# Patient Record
Sex: Female | Born: 1960 | Race: Black or African American | Hispanic: No | Smoking: Never smoker
Health system: Southern US, Community
[De-identification: ages and names within clinical notes are randomized; demographics above are authoritative.]

## PROBLEM LIST (undated history)

## (undated) DIAGNOSIS — T7840XA Allergy, unspecified, initial encounter: Secondary | ICD-10-CM

## (undated) DIAGNOSIS — M1990 Unspecified osteoarthritis, unspecified site: Secondary | ICD-10-CM

## (undated) DIAGNOSIS — E119 Type 2 diabetes mellitus without complications: Secondary | ICD-10-CM

## (undated) DIAGNOSIS — M5020 Other cervical disc displacement, unspecified cervical region: Secondary | ICD-10-CM

## (undated) DIAGNOSIS — E785 Hyperlipidemia, unspecified: Secondary | ICD-10-CM

## (undated) DIAGNOSIS — S0300XA Dislocation of jaw, unspecified side, initial encounter: Secondary | ICD-10-CM

## (undated) DIAGNOSIS — N6009 Solitary cyst of unspecified breast: Secondary | ICD-10-CM

## (undated) DIAGNOSIS — D649 Anemia, unspecified: Secondary | ICD-10-CM

## (undated) DIAGNOSIS — I739 Peripheral vascular disease, unspecified: Secondary | ICD-10-CM

## (undated) DIAGNOSIS — I10 Essential (primary) hypertension: Secondary | ICD-10-CM

## (undated) DIAGNOSIS — K219 Gastro-esophageal reflux disease without esophagitis: Secondary | ICD-10-CM

## (undated) DIAGNOSIS — K649 Unspecified hemorrhoids: Secondary | ICD-10-CM

## (undated) HISTORY — DX: Peripheral vascular disease, unspecified: I73.9

## (undated) HISTORY — DX: Gastro-esophageal reflux disease without esophagitis: K21.9

## (undated) HISTORY — PX: ABDOMINAL HYSTERECTOMY: SHX81

## (undated) HISTORY — PX: BILATERAL CARPAL TUNNEL RELEASE: SHX6508

## (undated) HISTORY — DX: Type 2 diabetes mellitus without complications: E11.9

## (undated) HISTORY — DX: Essential (primary) hypertension: I10

## (undated) HISTORY — DX: Allergy, unspecified, initial encounter: T78.40XA

## (undated) HISTORY — PX: BREAST CYST ASPIRATION: SHX578

## (undated) HISTORY — DX: Unspecified hemorrhoids: K64.9

## (undated) HISTORY — DX: Anemia, unspecified: D64.9

## (undated) HISTORY — DX: Other cervical disc displacement, unspecified cervical region: M50.20

## (undated) HISTORY — PX: VEIN SURGERY: SHX48

## (undated) HISTORY — PX: HAND SURGERY: SHX662

## (undated) HISTORY — DX: Hyperlipidemia, unspecified: E78.5

## (undated) HISTORY — DX: Unspecified osteoarthritis, unspecified site: M19.90

---

## 1988-06-08 HISTORY — PX: TUBAL LIGATION: SHX77

## 2005-04-21 ENCOUNTER — Emergency Department: Payer: Self-pay | Admitting: Emergency Medicine

## 2005-05-27 ENCOUNTER — Ambulatory Visit: Payer: Self-pay | Admitting: Family Medicine

## 2006-09-07 ENCOUNTER — Ambulatory Visit: Payer: Self-pay | Admitting: Nurse Practitioner

## 2006-11-02 ENCOUNTER — Ambulatory Visit: Payer: Self-pay | Admitting: Nurse Practitioner

## 2007-11-23 ENCOUNTER — Ambulatory Visit: Payer: Self-pay | Admitting: Family Medicine

## 2008-11-23 ENCOUNTER — Ambulatory Visit: Payer: Self-pay | Admitting: Family Medicine

## 2008-11-28 ENCOUNTER — Ambulatory Visit: Payer: Self-pay | Admitting: Family Medicine

## 2009-01-22 ENCOUNTER — Emergency Department: Payer: Self-pay | Admitting: Emergency Medicine

## 2009-05-29 ENCOUNTER — Ambulatory Visit: Payer: Self-pay | Admitting: Family Medicine

## 2009-08-06 ENCOUNTER — Ambulatory Visit: Payer: Self-pay | Admitting: Family Medicine

## 2009-11-28 ENCOUNTER — Ambulatory Visit: Payer: Self-pay | Admitting: Family Medicine

## 2009-12-17 ENCOUNTER — Encounter: Payer: Self-pay | Admitting: Family Medicine

## 2010-01-07 ENCOUNTER — Ambulatory Visit: Payer: Self-pay | Admitting: Gastroenterology

## 2010-01-09 LAB — PATHOLOGY REPORT

## 2010-12-03 ENCOUNTER — Ambulatory Visit: Payer: Self-pay | Admitting: Family Medicine

## 2011-08-31 ENCOUNTER — Ambulatory Visit: Payer: Self-pay | Admitting: Specialist

## 2011-10-28 ENCOUNTER — Ambulatory Visit: Payer: Self-pay | Admitting: Anesthesiology

## 2011-11-04 ENCOUNTER — Ambulatory Visit: Payer: Self-pay | Admitting: Anesthesiology

## 2011-12-03 ENCOUNTER — Ambulatory Visit: Payer: Self-pay | Admitting: Anesthesiology

## 2011-12-07 ENCOUNTER — Ambulatory Visit: Payer: Self-pay | Admitting: Family Medicine

## 2012-01-06 ENCOUNTER — Ambulatory Visit: Payer: Self-pay | Admitting: Anesthesiology

## 2012-01-25 ENCOUNTER — Ambulatory Visit: Payer: Self-pay | Admitting: Anesthesiology

## 2012-02-22 ENCOUNTER — Encounter: Payer: Self-pay | Admitting: Anesthesiology

## 2012-03-01 ENCOUNTER — Ambulatory Visit: Payer: Self-pay | Admitting: Anesthesiology

## 2012-07-25 ENCOUNTER — Emergency Department: Payer: Self-pay | Admitting: Emergency Medicine

## 2012-07-25 LAB — CBC
HCT: 38.6 % (ref 35.0–47.0)
HGB: 12.7 g/dL (ref 12.0–16.0)
MCH: 28.4 pg (ref 26.0–34.0)
MCHC: 32.9 g/dL (ref 32.0–36.0)
MCV: 86 fL (ref 80–100)
RBC: 4.48 10*6/uL (ref 3.80–5.20)
RDW: 13.2 % (ref 11.5–14.5)
WBC: 7.8 10*3/uL (ref 3.6–11.0)

## 2012-07-25 LAB — BASIC METABOLIC PANEL
Anion Gap: 8 (ref 7–16)
BUN: 10 mg/dL (ref 7–18)
Calcium, Total: 8.7 mg/dL (ref 8.5–10.1)
Chloride: 104 mmol/L (ref 98–107)
Creatinine: 0.76 mg/dL (ref 0.60–1.30)
EGFR (African American): >60
Osmolality: 280 (ref 275–301)
Potassium: 3.1 mmol/L — ABNORMAL LOW (ref 3.5–5.1)
Sodium: 136 mmol/L (ref 136–145)

## 2012-07-25 LAB — TROPONIN I: Troponin-I: <0.02 ng/mL

## 2012-07-26 LAB — CK TOTAL AND CKMB (NOT AT ARMC): CK-MB: <0.5 ng/mL — ABNORMAL LOW (ref 0.5–3.6)

## 2012-11-02 ENCOUNTER — Emergency Department: Payer: Self-pay | Admitting: Internal Medicine

## 2012-12-21 ENCOUNTER — Ambulatory Visit: Payer: Self-pay

## 2013-02-04 ENCOUNTER — Emergency Department: Payer: Self-pay | Admitting: Emergency Medicine

## 2013-05-21 ENCOUNTER — Emergency Department: Payer: Self-pay | Admitting: Emergency Medicine

## 2013-05-24 ENCOUNTER — Ambulatory Visit: Payer: Self-pay | Admitting: Adult Health

## 2013-05-31 ENCOUNTER — Ambulatory Visit: Payer: Self-pay | Admitting: Adult Health

## 2013-09-07 ENCOUNTER — Ambulatory Visit: Payer: Self-pay

## 2013-12-13 ENCOUNTER — Ambulatory Visit: Payer: Self-pay | Admitting: Urology

## 2013-12-25 ENCOUNTER — Emergency Department: Payer: Self-pay | Admitting: Emergency Medicine

## 2013-12-25 LAB — CBC
HCT: 42.7 % (ref 35.0–47.0)
HGB: 13.8 g/dL (ref 12.0–16.0)
MCH: 28.7 pg (ref 26.0–34.0)
MCHC: 32.4 g/dL (ref 32.0–36.0)
MCV: 89 fL (ref 80–100)
Platelet: 395 10*3/uL (ref 150–440)
RBC: 4.82 10*6/uL (ref 3.80–5.20)
RDW: 13.3 % (ref 11.5–14.5)
WBC: 8.8 10*3/uL (ref 3.6–11.0)

## 2013-12-25 LAB — URINALYSIS, COMPLETE
BILIRUBIN, UR: NEGATIVE
Blood: NEGATIVE
Glucose,UR: NEGATIVE mg/dL (ref 0–75)
NITRITE: NEGATIVE
PH: 5 (ref 4.5–8.0)
PROTEIN: NEGATIVE
RBC,UR: 3 /HPF (ref 0–5)
Specific Gravity: 1.02 (ref 1.003–1.030)
WBC UR: 10 /HPF (ref 0–5)

## 2013-12-25 LAB — COMPREHENSIVE METABOLIC PANEL
ANION GAP: 8 (ref 7–16)
AST: 17 U/L (ref 15–37)
Albumin: 3.8 g/dL (ref 3.4–5.0)
Alkaline Phosphatase: 53 U/L
BILIRUBIN TOTAL: 0.5 mg/dL (ref 0.2–1.0)
BUN: 13 mg/dL (ref 7–18)
CALCIUM: 9.2 mg/dL (ref 8.5–10.1)
CO2: 26 mmol/L (ref 21–32)
CREATININE: 0.92 mg/dL (ref 0.60–1.30)
Chloride: 102 mmol/L (ref 98–107)
EGFR (Non-African Amer.): >60
GLUCOSE: 140 mg/dL — AB (ref 65–99)
Osmolality: 274 (ref 275–301)
Potassium: 3.3 mmol/L — ABNORMAL LOW (ref 3.5–5.1)
SGPT (ALT): 15 U/L (ref 12–78)
Sodium: 136 mmol/L (ref 136–145)
Total Protein: 8.1 g/dL (ref 6.4–8.2)

## 2013-12-25 LAB — LIPASE, BLOOD: LIPASE: 214 U/L (ref 73–393)

## 2014-05-22 ENCOUNTER — Emergency Department: Payer: Self-pay | Admitting: Emergency Medicine

## 2014-05-22 LAB — CBC WITH DIFFERENTIAL/PLATELET
Basophil #: 0.1 10*3/uL (ref 0.0–0.1)
Basophil %: 1.3 %
EOS ABS: 0.3 10*3/uL (ref 0.0–0.7)
EOS PCT: 4.8 %
HCT: 42.7 % (ref 35.0–47.0)
HGB: 13.8 g/dL (ref 12.0–16.0)
Lymphocyte #: 2.3 10*3/uL (ref 1.0–3.6)
Lymphocyte %: 35.9 %
MCH: 28.6 pg (ref 26.0–34.0)
MCHC: 32.3 g/dL (ref 32.0–36.0)
MCV: 89 fL (ref 80–100)
Monocyte #: 0.4 x10 3/mm (ref 0.2–0.9)
Monocyte %: 6 %
NEUTROS PCT: 52 %
Neutrophil #: 3.3 10*3/uL (ref 1.4–6.5)
Platelet: 376 10*3/uL (ref 150–440)
RBC: 4.81 10*6/uL (ref 3.80–5.20)
RDW: 13.1 % (ref 11.5–14.5)
WBC: 6.3 10*3/uL (ref 3.6–11.0)

## 2014-05-22 LAB — URINALYSIS, COMPLETE
BACTERIA: NONE SEEN
Bilirubin,UR: NEGATIVE
Blood: NEGATIVE
Glucose,UR: >=500 mg/dL (ref 0–75)
KETONE: NEGATIVE
Leukocyte Esterase: NEGATIVE
NITRITE: NEGATIVE
Ph: 6 (ref 4.5–8.0)
Protein: NEGATIVE
RBC,UR: <1 /HPF (ref 0–5)
SQUAMOUS EPITHELIAL: NONE SEEN
Specific Gravity: 1.014 (ref 1.003–1.030)
WBC UR: <1 /HPF (ref 0–5)

## 2014-05-22 LAB — BASIC METABOLIC PANEL
ANION GAP: 7 (ref 7–16)
BUN: 11 mg/dL (ref 7–18)
CO2: 27 mmol/L (ref 21–32)
Calcium, Total: 9.3 mg/dL (ref 8.5–10.1)
Chloride: 102 mmol/L (ref 98–107)
Creatinine: 0.88 mg/dL (ref 0.60–1.30)
EGFR (African American): >60
EGFR (Non-African Amer.): >60
Glucose: 201 mg/dL — ABNORMAL HIGH (ref 65–99)
Osmolality: 277 (ref 275–301)
POTASSIUM: 3.7 mmol/L (ref 3.5–5.1)
Sodium: 136 mmol/L (ref 136–145)

## 2014-05-22 LAB — TROPONIN I: Troponin-I: <0.02 ng/mL

## 2014-07-19 LAB — CBC AND DIFFERENTIAL
HCT: 39 % (ref 36–46)
Hemoglobin: 13.5 g/dL (ref 12.0–16.0)
NEUTROS ABS: 5 /uL
Platelets: 425 10*3/uL — AB (ref 150–399)
WBC: 9 10^3/mL

## 2014-07-19 LAB — LIPID PANEL
Cholesterol: 210 mg/dL — AB (ref 0–200)
HDL: 60 mg/dL (ref 35–70)
LDL CALC: 106 mg/dL
TRIGLYCERIDES: 220 mg/dL — AB (ref 40–160)

## 2014-07-19 LAB — BASIC METABOLIC PANEL
BUN: 9 mg/dL (ref 4–21)
CREATININE: 0.7 mg/dL (ref 0.5–1.1)
GLUCOSE: 90 mg/dL
Potassium: 4.2 mmol/L (ref 3.4–5.3)
SODIUM: 139 mmol/L (ref 137–147)

## 2014-07-19 LAB — HEMOGLOBIN A1C: Hemoglobin A1C: 8.7

## 2014-07-19 LAB — HEPATIC FUNCTION PANEL
ALT: 6 U/L — AB (ref 7–35)
AST: 8 U/L — AB (ref 13–35)
Alkaline Phosphatase: 71 U/L (ref 25–125)
BILIRUBIN DIRECT: 0.12 mg/dL (ref 0.01–0.4)
BILIRUBIN, TOTAL: 0.4 mg/dL

## 2014-07-19 LAB — TSH: TSH: 1.64 u[IU]/mL (ref 0.41–5.90)

## 2015-11-04 IMAGING — CT CT ABD-PELV W/ CM
2 of 5 series · 16 of 46 positions shown, 18 images · IV contrast (agent unspecified)
Comparison: None.

CLINICAL DATA: Abdominal pain, right lower quadrant for 2 weeks.

EXAM:
CT ABDOMEN AND PELVIS WITH CONTRAST
TECHNIQUE: Multidetector CT imaging of the abdomen and pelvis was performed
using the standard protocol following bolus administration of
intravenous contrast.
CONTRAST:  100 mL [id number]

[Series 2: routine abd pel with · axial · 0.77mm/px · z∈[-576,-122]mm · 13 of 103 slices shown, 15 images]
[im 6/103  soft-tissue]
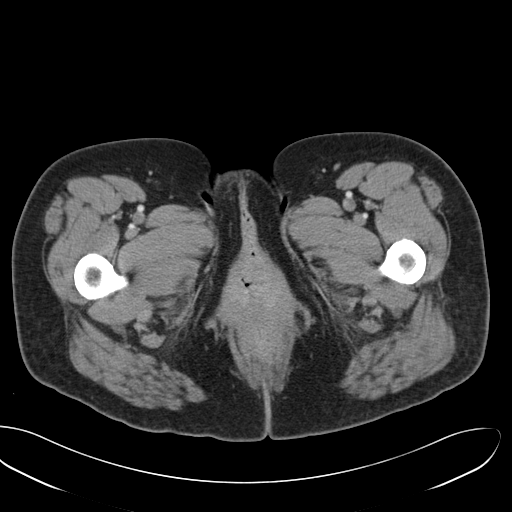
[im 6/103  bone]
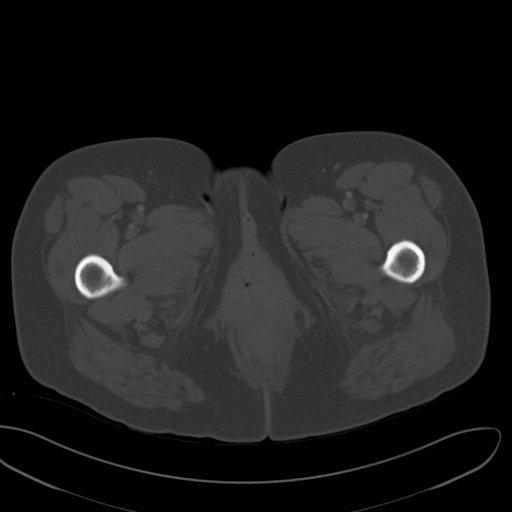
[im 12/103  soft-tissue]
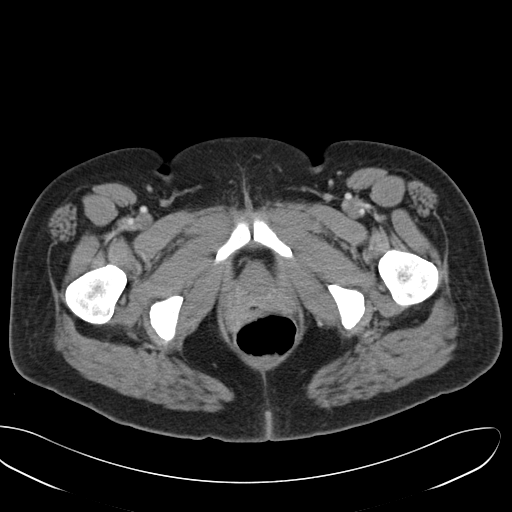
[im 23/103  soft-tissue]
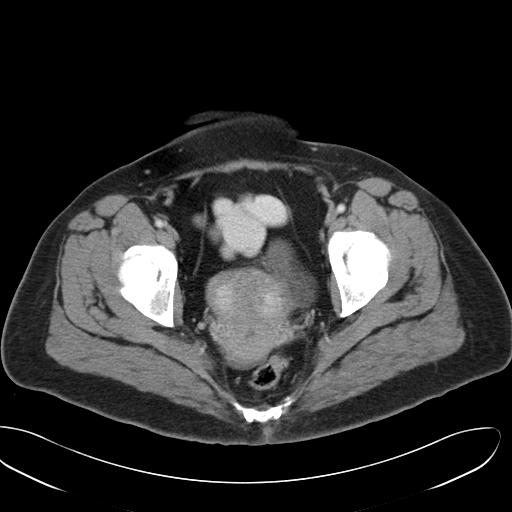
[im 29/103  soft-tissue]
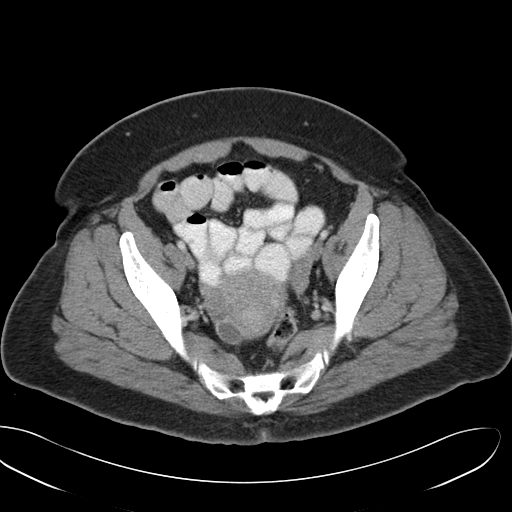
[im 35/103  soft-tissue]
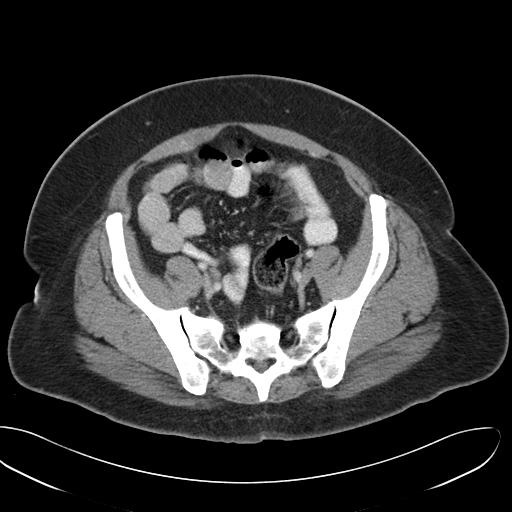
[im 46/103  soft-tissue]
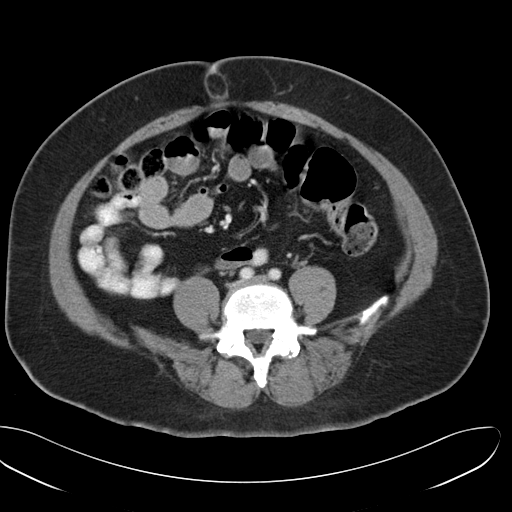
[im 52/103  soft-tissue]
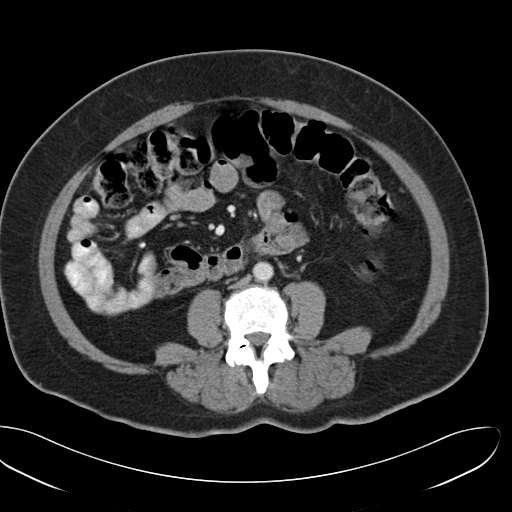
[im 57/103  soft-tissue]
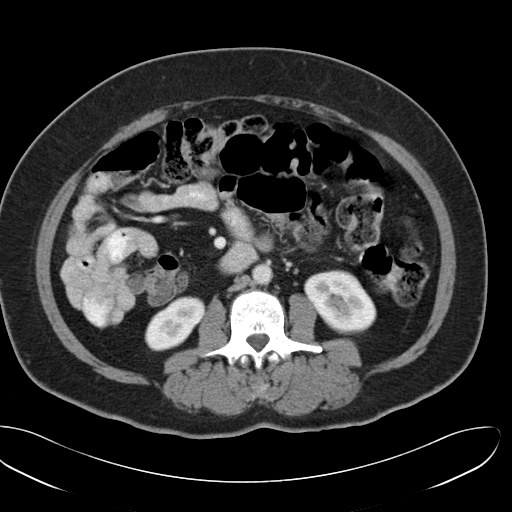
[im 69/103  soft-tissue]
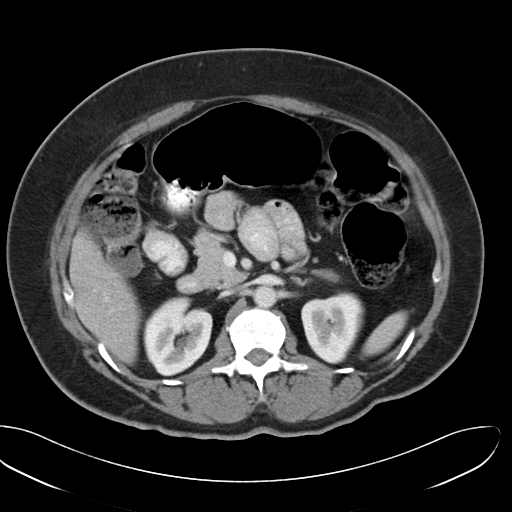
[im 69/103  bone]
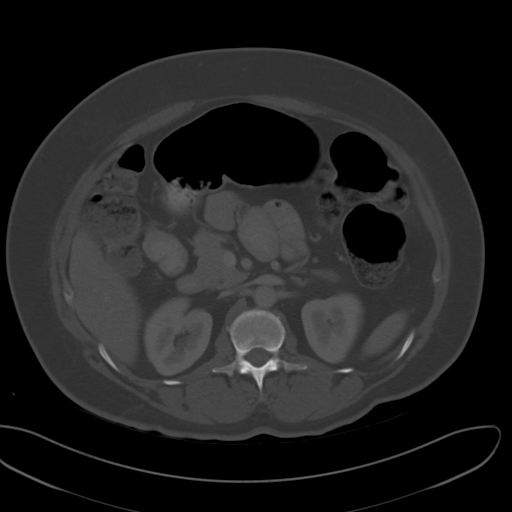
[im 74/103  soft-tissue]
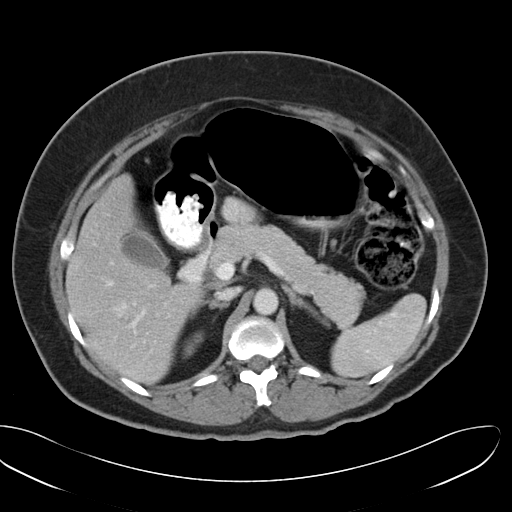
[im 80/103  soft-tissue]
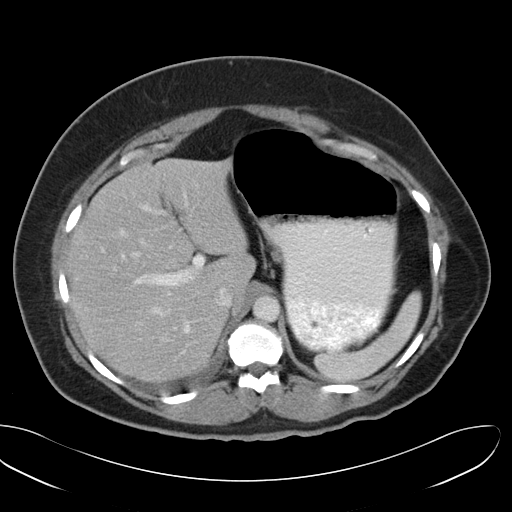
[im 91/103  soft-tissue]
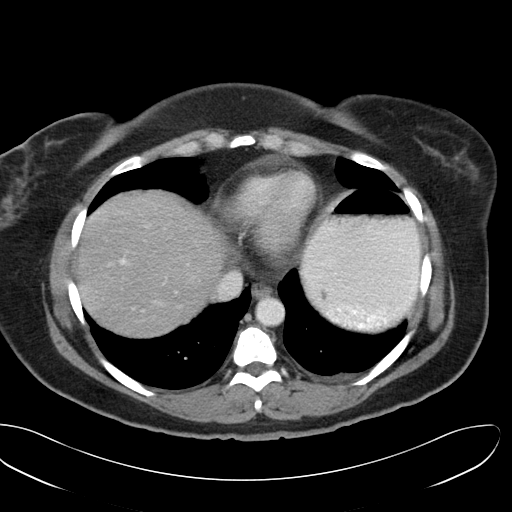
[im 97/103  soft-tissue]
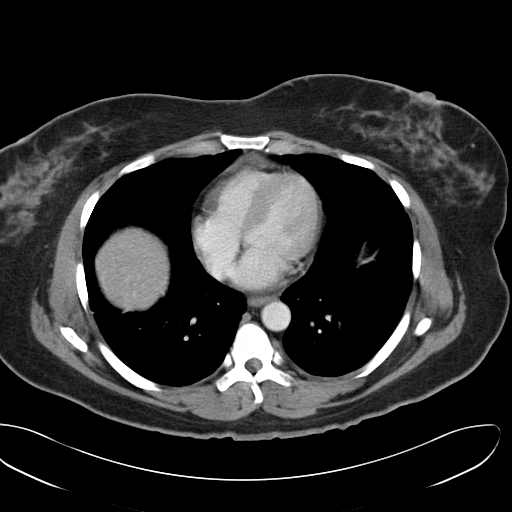

[Series 6: cor routine abd pel with · coronal · 0.82mm/px · 3 of 168 slices shown]
[im 56/168  soft-tissue]
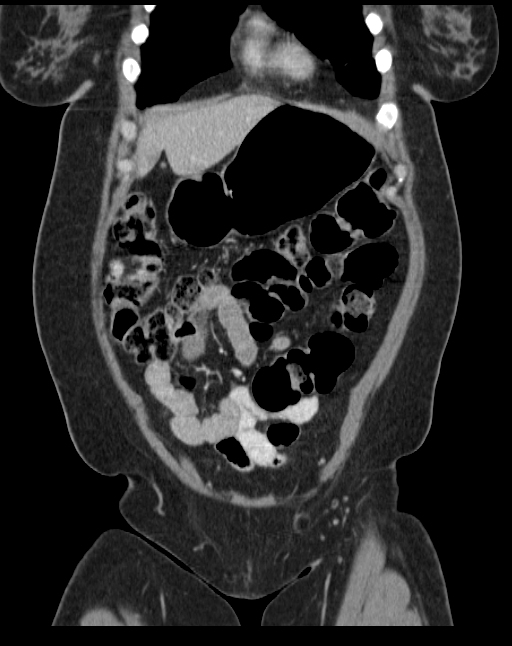
[im 75/168  soft-tissue]
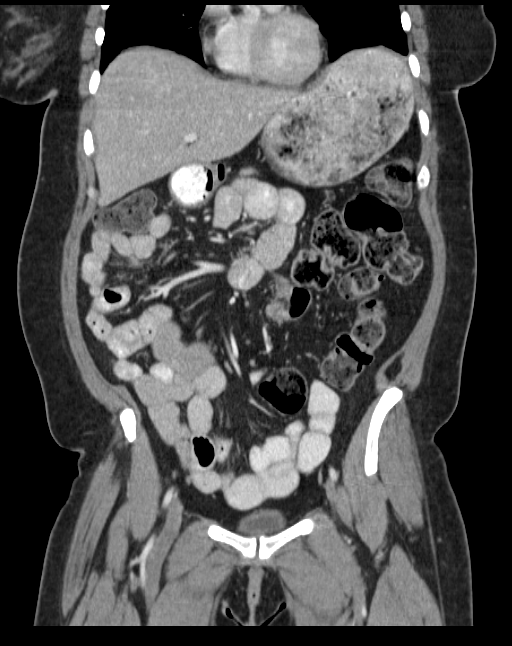
[im 93/168  soft-tissue]
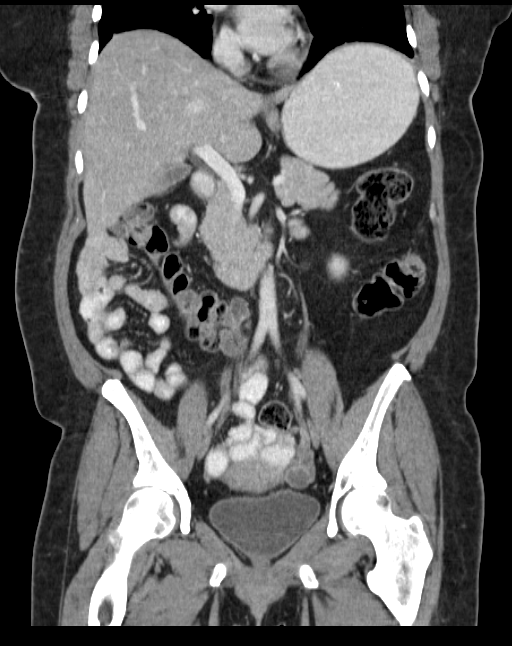

[16 of 46 positions shown; findings below may reference images not displayed]

FINDINGS: Visualization of the lower thorax demonstrates normal heart size.
Small amount of pericardial fluid. Minimal dependent atelectasis in
the bilateral lower lobes. Small bilateral pleural effusions.

Liver is normal in size and contour. 3 mm low-attenuation lesion
anterior right hepatic lobe (image 22; series 2), too small to
accurately characterize. The portal vein is patent. Gallbladder is
unremarkable.

The spleen, pancreas and bilateral adrenal glands are unremarkable.
Kidneys are symmetric in size and enhance with contrast. No
hydronephrosis.

Normal caliber abdominal aorta.  Urinary bladder is unremarkable.

Fibroid uterus. 2.5 x 2.1 x 2.5 cm cystic left adnexal structure
(image 78; series [DATE] x 1.7 x 2.7 cm cystic right adnexal
structure (image 74; series 2).

Normal caliber small and large bowel without evidence for
obstruction. The cecum is located within the central abdomen. The
majority of the small bowel lies within the right hemi abdomen. The
appendix is normal in appearance. No free fluid or free
intraperitoneal air.

No aggressive or acute appearing osseous lesions. Lower lumbar spine
degenerative changes.
IMPRESSION: No definite cause for acute abdominal pain identified.

There is suggestion of partial malrotation of the bowel with the
majority of the small bowel lying in the right hemi abdomen and the
cecum lying within the central aspect of the abdomen.

Fibroid uterus. Bilateral cystic adnexal structures. Recommend
dedicated evaluation with pelvic ultrasound.

## 2016-02-01 ENCOUNTER — Ambulatory Visit
Admission: EM | Admit: 2016-02-01 | Discharge: 2016-02-01 | Disposition: A | Discharge status: Home / Self Care | Payer: Commercial Managed Care - HMO | Attending: Family Medicine | Admitting: Family Medicine

## 2016-02-01 DIAGNOSIS — H6983 Other specified disorders of Eustachian tube, bilateral: Secondary | ICD-10-CM

## 2016-02-01 DIAGNOSIS — H9203 Otalgia, bilateral: Secondary | ICD-10-CM | POA: Diagnosis not present

## 2016-02-01 HISTORY — DX: Dislocation of jaw, unspecified side, initial encounter: S03.00XA

## 2016-02-01 HISTORY — DX: Solitary cyst of unspecified breast: N60.09

## 2016-02-01 MED ORDER — PREDNISONE 10 MG (21) PO TBPK: ORAL_TABLET | ORAL | 0 refills | Status: AC

## 2016-02-01 MED ORDER — LORATADINE-PSEUDOEPHEDRINE ER 10-240 MG PO TB24: 1.0000 | ORAL_TABLET | Freq: Every day | ORAL | 0 refills | Status: AC

## 2016-02-01 NOTE — ED Provider Notes (Addendum)
[facility]    CSN: [id number] Arrival date & time: 02/01/16  4098  First Provider Contact:  None       History   Chief Complaint Chief Complaint  Patient presents with  . Otalgia    both ears    HPI Wendy Jacobson is a 55 y.o. female.   Patient is here because of pain in her ears. She states that her ears been bothering her for several days but this morning the pain was a lot worse. She reports a sharp jabbing pain in both ears that goes inside. 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 She is unable to give a very good history of her medical problems and states she tries to find the list of medical problems on her cell phone but his trouble pulling up the list. She denies any nausea. She denies ever having this sensation before. She is on a minute the of medications. She does have diabetes hypertension. She does not smoke and she reports allergy to ibuprofen. Strong family history of heart disease hypertension and heart disease in the family. Mother and father both had had heart attacks hypertension and her daughter has had hypertension and heart attack as well. She's had multiple orthopedic surgeries in her hand I tubal ligation and hysterectomy.   The history is provided by the patient. No language interpreter was used.  Otalgia  Location:  Bilateral Behind ear:  Swelling Quality:  Aching and shooting Onset quality:  Unable to specify Timing:  Constant Progression:  Worsening Chronicity:  New Context: foreign body   Context: not elevation change, not loud noise, not recent URI and not water in ear   Relieved by:  Nothing Worsened by:  Nothing Ineffective treatments:  None tried Associated symptoms: congestion and headaches   Associated symptoms: no cough, no ear discharge, no fever, no hearing loss, no neck pain, no rhinorrhea, no sore throat, no tinnitus and no vomiting      Past Medical History:  Diagnosis Date  . Allergy    Seasonal  . Anemia   . Arthritis   . Breast cyst   . Diabetes mellitus without complication (HCC)   . GERD (gastroesophageal reflux disease)   . Hemorrhoids   . Herniated disc, cervical   . Hyperlipidemia   . Hypertension   . PVD (peripheral vascular disease) (HCC)   . TMJ (dislocation of temporomandibular joint)     There are no active problems to display for this patient.   Past Surgical History:  Procedure Laterality Date  . ABDOMINAL HYSTERECTOMY    . BILATERAL CARPAL TUNNEL RELEASE    . BREAST CYST ASPIRATION Left   . HAND SURGERY    . TUBAL LIGATION  1990  . VEIN SURGERY      OB History    No data available       Home Medications    Prior to Admission medications   Medication Sig Start Date End Date Taking? Authorizing Provider  albuterol (PROVENTIL HFA;VENTOLIN HFA) 108 (90 Base) MCG/ACT inhaler Inhale into the lungs every 6 (six) hours as needed for wheezing or shortness of breath.   Yes Historical Provider, MD  Ascorbic Acid (VITAMIN C) 1000 MG tablet Take 1,000 mg by mouth daily.   Yes Historical Provider, MD  aspirin 81 MG chewable tablet Chew by mouth daily.   Yes Historical Provider, MD  BETA CAROTENE PO Take by  mouth.   Yes Historical Provider, MD  calcium-vitamin D (OSCAL WITH D) 500-200 MG-UNIT tablet Take 1 tablet by mouth.   Yes Historical Provider, MD  gabapentin (NEURONTIN) 100 MG capsule Take 100 mg by mouth 3 (three) times daily.   Yes Historical Provider, MD  glipiZIDE (GLUCOTROL XL) 10 MG 24 hr tablet Take 10 mg by mouth daily with breakfast.   Yes Historical Provider, MD  hydrochlorothiazide (HYDRODIURIL) 12.5 MG tablet Take 12.5 mg by mouth daily.   Yes Historical Provider, MD  loratadine (CLARITIN) 10 MG tablet Take 10 mg by mouth daily.   Yes Historical Provider, MD  losartan (COZAAR) 100 MG tablet Take 100 mg by mouth daily.   Yes Historical Provider, MD  meloxicam (MOBIC) 15 MG  tablet Take 15 mg by mouth daily.   Yes Historical Provider, MD  metFORMIN (GLUCOPHAGE) 1000 MG tablet Take 1,000 mg by mouth 2 (two) times daily with a meal.   Yes Historical Provider, MD  OMEPRAZOLE PO Take 40 mg by mouth.   Yes Historical Provider, MD  simvastatin (ZOCOR) 40 MG tablet Take 40 mg by mouth daily.   Yes Historical Provider, MD  vitamin A (VITAMIN A) 10000 UNIT capsule Take 10,000 Units by mouth daily.   Yes Historical Provider, MD  loratadine-pseudoephedrine (CLARITIN-D 24 HOUR) 10-240 MG 24 hr tablet Take 1 tablet by mouth daily. Do not take w/claritin 02/01/16   Hassan RowanEugene Zade Falkner, MD  predniSONE (STERAPRED UNI-PAK 21 TAB) 10 MG (21) TBPK tablet Sig 6 tablet day 1, 5 tablets day 2, 4 tablets day 3,,3tablets day 4, 2 tablets day 5, 1 tablet day 6 take all tablets orally 02/01/16   Hassan RowanEugene Donta Mcinroy, MD    Family History Family History  Problem Relation Age of Onset  . Heart attack Mother   . Hypertension Mother   . Cancer Mother     Breast  . Heart attack Father   . Diabetes Father   . Hypertension Father   . Cancer Father     Colon  . Heart attack Daughter   . Hypertension Daughter     Social History Social History  Substance Use Topics  . Smoking status: Never Smoker  . Smokeless tobacco: Never Used  . Alcohol use No     Allergies   Ibuprofen   Review of Systems Review of Systems  Constitutional: Positive for fatigue. Negative for fever.  HENT: Positive for congestion and ear pain. Negative for ear discharge, hearing loss, rhinorrhea, sore throat and tinnitus.   Respiratory: Negative for cough.   Gastrointestinal: Negative for vomiting.  Musculoskeletal: Negative for neck pain.  Neurological: Positive for headaches.  All other systems reviewed and are negative.    Physical Exam Triage Vital Signs ED Triage Vitals  Enc Vitals Group     BP 02/01/16 1010 138/82     Pulse Rate 02/01/16 1010 83     Resp 02/01/16 1010 16     Temp 02/01/16 1010 98.3 F (36.8 C)      Temp Source 02/01/16 1010 Oral     SpO2 02/01/16 1010 100 %     Weight 02/01/16 1012 188 lb (85.3 kg)     Height 02/01/16 1012 5' 4.5" (1.638 m)     Head Circumference --      Peak Flow --      Pain Score 02/01/16 1018 10     Pain Loc --      Pain Edu? --      Excl. in  GC? --    No data found.   Updated Vital Signs BP 138/82 (BP Location: Right Arm)   Pulse 83   Temp 98.3 F (36.8 C) (Oral)   Resp 16   Ht 5' 4.5" (1.638 m)   Wt 188 lb (85.3 kg)   SpO2 100%   BMI 31.77 kg/m   Visual Acuity Right Eye Distance:   Left Eye Distance:   Bilateral Distance:    Right Eye Near:   Left Eye Near:    Bilateral Near:     Physical Exam  Constitutional: She appears well-developed and well-nourished.  HENT:  Head: Normocephalic.  Right Ear: Hearing, tympanic membrane, external ear and ear canal normal.  Left Ear: Hearing, tympanic membrane, external ear and ear canal normal.  Nose: Mucosal edema present. No rhinorrhea.  Mouth/Throat: Uvula is midline and oropharynx is clear and moist. Normal dentition.  She has some bilateral TMJ tenderness on both sides of her face but she also has tenderness behind both ears as well more consistent with eustachian tube dysfunction  Eyes: Pupils are equal, round, and reactive to light.  Neck: Normal range of motion. Neck supple.  Pulmonary/Chest: Effort normal.  Neurological: She is alert.  Skin: Skin is warm and dry. Pallor: at this point we're going treat patient for eustachian tube dysfunction. She does have TMJ J tenderness but the pain elicited from palpating her TMJ is different from the pain in the deep ear pain that she is having today.  Psychiatric: Her speech is normal and behavior is normal.  Vitals reviewed.    UC Treatments / Results  Labs (all labs ordered are listed, but only abnormal results are displayed) Labs Reviewed - No data to display  EKG  EKG Interpretation None       Radiology No results  found.  Procedures Procedures (including critical care time)  Medications Ordered in UC Medications - No data to display   Initial Impression / Assessment and Plan / UC Course  I have reviewed the triage vital signs and the nursing notes.  Pertinent labs & imaging results that were available during my care of the patient were reviewed by me and considered in my medical decision making (see chart for details).  Clinical Course   Patient will be treated for eustachian tube dysfunction. She has had TMJ problems before but the tenderness over her TMJ is not the same as the pain that she is having now. We'll place on prednisone since she can't really come in with she's been on steroid nasal sprays she's not using them on a regular basis. 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  Final Clinical Impressions(s) / UC Diagnoses   Final diagnoses:  Otalgia, bilateral  Eustachian tube dysfunction, bilateral    New Prescriptions Discharge Medication List as of 02/01/2016 10:50 AM    START taking these medications   Details  loratadine-pseudoephedrine (CLARITIN-D 24 HOUR) 10-240 MG 24 hr tablet Take 1 tablet by mouth daily. Do not take w/claritin, Starting Sat 02/01/2016, Normal    predniSONE (STERAPRED UNI-PAK 21 TAB) 10 MG (21) TBPK tablet Sig 6 tablet day 1, 5 tablets day 2, 4 tablets day 3,,3tablets day 4, 2 tablets day 5, 1 tablet day 6 take all tablets orally, Normal         Hassan Rowan, MD 02/01/16 1149     Note: This dictation was prepared with Dragon dictation  along with smaller phrase technology. Any transcriptional errors that result from this process are unintentional.   Hassan Rowan, MD 02/01/16 1150

## 2016-02-01 NOTE — ED Triage Notes (Signed)
As per patient both ears hurts onset 2 weeks on and off but worst pain this morning around 5:30 am pt had HA and some congestion, dizziness but no chest pain and off balance and leg pain.

## 2019-03-23 ENCOUNTER — Other Ambulatory Visit: Payer: Self-pay

## 2019-03-23 DIAGNOSIS — Z20822 Contact with and (suspected) exposure to covid-19: Secondary | ICD-10-CM

## 2019-03-24 LAB — NOVEL CORONAVIRUS, NAA: SARS-CoV-2, NAA: NOT DETECTED
